# Patient Record
Sex: Female | Born: 1980 | Race: White | Hispanic: No | Marital: Single | Smoking: Never smoker
Health system: Southern US, Community
[De-identification: ages and names within clinical notes are randomized; demographics above are authoritative.]

## PROBLEM LIST (undated history)

## (undated) DIAGNOSIS — E230 Hypopituitarism: Secondary | ICD-10-CM

## (undated) DIAGNOSIS — F909 Attention-deficit hyperactivity disorder, unspecified type: Secondary | ICD-10-CM

## (undated) DIAGNOSIS — F411 Generalized anxiety disorder: Secondary | ICD-10-CM

## (undated) HISTORY — DX: Attention-deficit hyperactivity disorder, unspecified type: F90.9

## (undated) HISTORY — DX: Generalized anxiety disorder: F41.1

## (undated) HISTORY — DX: Hypopituitarism: E23.0

---

## 2013-02-15 DIAGNOSIS — E230 Hypopituitarism: Secondary | ICD-10-CM

## 2013-02-15 DIAGNOSIS — N915 Oligomenorrhea, unspecified: Secondary | ICD-10-CM | POA: Insufficient documentation

## 2013-02-15 HISTORY — DX: Hypopituitarism: E23.0

## 2014-01-09 DIAGNOSIS — F411 Generalized anxiety disorder: Secondary | ICD-10-CM

## 2014-01-09 HISTORY — DX: Generalized anxiety disorder: F41.1

## 2015-03-09 ENCOUNTER — Encounter: Payer: Self-pay | Admitting: *Deleted

## 2015-03-09 ENCOUNTER — Emergency Department (INDEPENDENT_AMBULATORY_CARE_PROVIDER_SITE_OTHER)
Admission: EM | Admit: 2015-03-09 | Discharge: 2015-03-09 | Disposition: A | Discharge status: Home / Self Care | Payer: Managed Care, Other (non HMO) | Source: Home / Self Care | Attending: Family Medicine | Admitting: Family Medicine

## 2015-03-09 DIAGNOSIS — S61451A Open bite of right hand, initial encounter: Secondary | ICD-10-CM | POA: Diagnosis not present

## 2015-03-09 DIAGNOSIS — L089 Local infection of the skin and subcutaneous tissue, unspecified: Secondary | ICD-10-CM | POA: Diagnosis not present

## 2015-03-09 DIAGNOSIS — S61452A Open bite of left hand, initial encounter: Secondary | ICD-10-CM

## 2015-03-09 DIAGNOSIS — Z23 Encounter for immunization: Secondary | ICD-10-CM

## 2015-03-09 DIAGNOSIS — S61259A Open bite of unspecified finger without damage to nail, initial encounter: Secondary | ICD-10-CM

## 2015-03-09 DIAGNOSIS — W5501XA Bitten by cat, initial encounter: Principal | ICD-10-CM

## 2015-03-09 MED ORDER — AMOXICILLIN-POT CLAVULANATE 875-125 MG PO TABS: 1.0000 | ORAL_TABLET | Freq: Two times a day (BID) | ORAL | Status: DC

## 2015-03-09 MED ORDER — KETOROLAC TROMETHAMINE 30 MG/ML IJ SOLN
30.0000 mg | Freq: Once | INTRAMUSCULAR | Status: AC
  Administered 2015-03-09: 30 mg via INTRAMUSCULAR

## 2015-03-09 MED ORDER — OXYCODONE HCL 5 MG PO CAPS: 5.0000 mg | ORAL_CAPSULE | ORAL | Status: DC | PRN

## 2015-03-09 MED ORDER — CLINDAMYCIN HCL 300 MG PO CAPS: 300.0000 mg | ORAL_CAPSULE | Freq: Four times a day (QID) | ORAL | Status: DC

## 2015-03-09 MED ORDER — TETANUS-DIPHTH-ACELL PERTUSSIS 5-2.5-18.5 LF-MCG/0.5 IM SUSP
0.5000 mL | Freq: Once | INTRAMUSCULAR | Status: AC
  Administered 2015-03-09: 0.5 mL via INTRAMUSCULAR

## 2015-03-09 NOTE — Discharge Instructions (Signed)
Elevate hand.  Keep wounds bandaged. If symptoms become significantly worse during the night or over the weekend, proceed to the local emergency room.    Animal Bite Animal bites can range from mild to serious. An animal bite can result in a scratch on the skin, a deep open cut, a puncture of the skin, a crush injury, or tearing away of the skin or a body part. A small bite from a house pet will usually not cause serious problems. However, some animal bites can become infected or injure a bone or other tissue.  Bites from certain animals can be more dangerous because of the risk of spreading rabies, which is a serious viral infection. This risk is higher with bites from stray animals or wild animals, such as raccoons, foxes, skunks, and bats. Dogs are responsible for most animal bites. Children are bitten more often than adults. SYMPTOMS  Common symptoms of an animal bite include:   Pain.   Bleeding.   Swelling.   Bruising.  DIAGNOSIS  This condition may be diagnosed based on a physical exam and medical history. Your health care provider will examine the wound and ask for details about the animal and how the bite happened. You may also have tests, such as:   Blood tests to check for infection or to determine if surgery is needed.  X-rays to check for damage to bones or joints.  Culture test. This uses a sample of fluid from the wound to check for infection. TREATMENT  Treatment varies depending on the location and type of animal bite and your medical history. Treatment may include:   Wound care. This often includes cleaning the wound, flushing the wound with saline solution, and applying a bandage (dressing). Sometimes, the wound is left open to heal because of the high risk of infection. However, in some cases, the wound may be closed with stitches (sutures), staples, skin glue, or adhesive strips.   Antibiotic medicine.   Tetanus shot.   Rabies treatment if the animal could  have rabies.  In some cases, bites that have become infected may require IV antibiotics and surgical treatment in the hospital.  HOME CARE INSTRUCTIONS Wound Care  Follow instructions from your health care provider about how to take care of your wound. Make sure you:  Wash your hands with soap and water before you change your dressing. If soap and water are not available, use hand sanitizer.  Change your dressing as told by your health care provider.  Leave sutures, skin glue, or adhesive strips in place. These skin closures may need to be in place for 2 weeks or longer. If adhesive strip edges start to loosen and curl up, you may trim the loose edges. Do not remove adhesive strips completely unless your health care provider tells you to do that.  Check your wound every day for signs of infection. Watch for:   Increasing redness, swelling, or pain.   Fluid, blood, or pus.  General Instructions  Take or apply over-the-counter and prescription medicines only as told by your health care provider.   If you were prescribed an antibiotic, take or apply it as told by your health care provider. Do not stop using the antibiotic even if your condition improves.   Keep the injured area raised (elevated) above the level of your heart while you are sitting or lying down, if this is possible.   If directed, apply ice to the injured area.   Put ice in a plastic  bag.   Place a towel between your skin and the bag.   Leave the ice on for 20 minutes, 2-3 times per day.   Keep all follow-up visits as told by your health care provider. This is important.  SEEK MEDICAL CARE IF:  You have increasing redness, swelling, or pain at the site of your wound.   You have a general feeling of sickness (malaise).   You feel nauseous or you vomit.   You have pain that does not get better.  SEEK IMMEDIATE MEDICAL CARE IF:  You have a red streak extending away from your wound.    You have fluid, blood, or pus coming from your wound.   You have a fever or chills.   You have trouble moving your injured area.   You have numbness or tingling extending beyond the wound.   This information is not intended to replace advice given to you by your health care provider. Make sure you discuss any questions you have with your health care provider.   Document Released: 02/01/2011 Document Revised: 02/04/2015 Document Reviewed: 10/01/2014 Elsevier Interactive Patient Education 2016 [employer]    Cellulitis Cellulitis is an infection of the skin and the tissue beneath it. The infected area is usually red and tender. Cellulitis occurs most often in the arms and lower legs.  CAUSES  Cellulitis is caused by bacteria that enter the skin through cracks or cuts in the skin. The most common types of bacteria that cause cellulitis are staphylococci and streptococci. SIGNS AND SYMPTOMS   Redness and warmth.  Swelling.  Tenderness or pain.  Fever. DIAGNOSIS  Your health care provider can usually determine what is wrong based on a physical exam. Blood tests may also be done. TREATMENT  Treatment usually involves taking an antibiotic medicine. HOME CARE INSTRUCTIONS   Take your antibiotic medicine as directed by your health care provider. Finish the antibiotic even if you start to feel better.  Keep the infected arm or leg elevated to reduce swelling.  Apply a warm cloth to the affected area up to 4 times per day to relieve pain.  Take medicines only as directed by your health care provider.  Keep all follow-up visits as directed by your health care provider. SEEK MEDICAL CARE IF:   You notice red streaks coming from the infected area.  Your red area gets larger or turns dark in color.  Your bone or joint underneath the infected area becomes painful after the skin has healed.  Your infection returns in the same area or another area.  You notice a swollen  bump in the infected area.  You develop new symptoms.  You have a fever. SEEK IMMEDIATE MEDICAL CARE IF:   You feel very sleepy.  You develop vomiting or diarrhea.  You have a general ill feeling (malaise) with muscle aches and pains.   This information is not intended to replace advice given to you by your health care provider. Make sure you discuss any questions you have with your health care provider.   Document Released: 02/23/2005 Document Revised: 02/04/2015 Document Reviewed: 08/01/2011 Elsevier Interactive Patient Education [employer]
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
## 2015-03-10 ENCOUNTER — Emergency Department (INDEPENDENT_AMBULATORY_CARE_PROVIDER_SITE_OTHER)
Admission: EM | Admit: 2015-03-10 | Discharge: 2015-03-10 | Disposition: A | Discharge status: Home / Self Care | Payer: Managed Care, Other (non HMO) | Source: Home / Self Care

## 2015-03-10 ENCOUNTER — Encounter: Payer: Self-pay | Admitting: *Deleted

## 2015-03-10 DIAGNOSIS — Z4800 Encounter for change or removal of nonsurgical wound dressing: Secondary | ICD-10-CM

## 2015-03-10 DIAGNOSIS — L089 Local infection of the skin and subcutaneous tissue, unspecified: Secondary | ICD-10-CM | POA: Diagnosis not present

## 2015-03-10 MED ORDER — OXYCODONE HCL 5 MG PO CAPS: ORAL_CAPSULE | ORAL | Status: DC

## 2015-03-10 MED ORDER — KETOROLAC TROMETHAMINE 60 MG/2ML IM SOLN
60.0000 mg | Freq: Once | INTRAMUSCULAR | Status: AC
  Administered 2015-03-10: 60 mg via INTRAMUSCULAR

## 2015-03-10 MED ORDER — KETOROLAC TROMETHAMINE 10 MG PO TABS: 10.0000 mg | ORAL_TABLET | Freq: Four times a day (QID) | ORAL | Status: DC | PRN

## 2015-03-10 NOTE — Discharge Instructions (Signed)
Keep hands elevated as much as possible.  Keep wounds lightly bandaged and change daily.  May apply heating pad every 2 to 3 hours.

## 2015-03-10 NOTE — ED Notes (Signed)
Cassandra Christensen is here today for a recheck of her cat bites.

## 2015-03-10 NOTE — ED Provider Notes (Signed)
CSN: [id number]     Arrival date & time 03/10/15  1336 History   None    Chief Complaint  Patient presents with  . Wound Check      HPI Comments: Patient returns for follow-up of cat-bite cellulitis fingers.  She reports that she still has significant pain, but was able to sleep with pain medication.  She has had three doses of her oral antibiotics.  The history is provided by the patient and a friend.    History reviewed. No pertinent past medical history. History reviewed. No pertinent past surgical history. History reviewed. No pertinent family history. Social History  Substance Use Topics  . Smoking status: Never Smoker   . Smokeless tobacco: None  . Alcohol Use: Yes     Comment: 3 q wk   OB History    No data available     Review of Systems No fevers, chills, and sweats.  Allergies  Latex  Home Medications   Prior to Admission medications   Medication Sig Start Date End Date Taking? Authorizing Provider  amoxicillin (AMOXIL) 500 MG capsule Take 500 mg by mouth 3 (three) times daily.    Historical Provider, MD  amoxicillin-clavulanate (AUGMENTIN) 875-125 MG tablet Take 1 tablet by mouth 2 (two) times daily. Take with food 03/09/15   Lattie Haw, MD  clindamycin (CLEOCIN) 300 MG capsule Take 1 capsule (300 mg total) by mouth 4 (four) times daily. 03/09/15   Lattie Haw, MD  estradiol (ESTRACE) 2 MG tablet Take 2 mg by mouth daily.    Historical Provider, MD  ketorolac (TORADOL) 10 MG tablet Take 1 tablet (10 mg total) by mouth every 6 (six) hours as needed for moderate pain. 03/10/15   Lattie Haw, MD  medroxyPROGESTERone (PROVERA) 5 MG tablet Take 5 mg by mouth daily.    Historical Provider, MD  oxycodone (OXY-IR) 5 MG capsule Take one cap by mouth every 4 to 6 hours as needed for pain.  May take two at bedtime 03/10/15   Lattie Haw, MD   Meds Ordered and Administered this Visit   Medications  ketorolac (TORADOL) injection 60 mg (60 mg Intramuscular  Given 03/10/15 1414)    BP 124/88 mmHg  Pulse 95  Temp(Src) 97.7 F (36.5 C) (Oral)  Resp 16  SpO2 100% No data found.   Physical Exam Nursing notes and Vital Signs reviewed. Appearance:  Patient appears comfortable but has pain during bandage removal. Left hand:  Second finger remains painful to palpation over PIP joint.  However, swelling and erythema have decreased, and there is no drainage from puncture wounds.  PIP joint has increased range of motion.  Erythema on dorsum of left hand almost resolved. Right hand:  Third finger tip remains tender to palpation, but swelling and erythema have decreased.  Range of motion DIP joint increased.  Erythema and ascending lymphangitis on dorsum of right hand has resolved.   ED Course  Procedures  None    MDM   1. Dressing change or removal, nonsurgical wound.  Wounds slightly improved; ascending lymphangitis cleared.    Administered Toradol  IM Wounds bandaged with non-stick dressings. Continue antibiotics.  Add oral Toradol for 4 days.   Keep hands elevated as much as possible.  Keep wounds lightly bandaged and change daily.  May apply heating pad every 2 to 3 hours. Recommend taking probiotic.  Discussed signs and symptoms of C. Diff colitis Return tomorrow for follow-up; if no interval improvement, recommend orthopedic  referral.    Lattie Haw, MD 03/10/15 [phone]

## 2015-03-11 ENCOUNTER — Encounter: Payer: Self-pay | Admitting: *Deleted

## 2015-03-11 ENCOUNTER — Emergency Department
Admission: EM | Admit: 2015-03-11 | Discharge: 2015-03-11 | Disposition: A | Discharge status: Home / Self Care | Payer: Managed Care, Other (non HMO) | Source: Home / Self Care | Attending: Family Medicine | Admitting: Family Medicine

## 2015-03-11 ENCOUNTER — Emergency Department (INDEPENDENT_AMBULATORY_CARE_PROVIDER_SITE_OTHER): Payer: Managed Care, Other (non HMO)

## 2015-03-11 ENCOUNTER — Encounter (HOSPITAL_COMMUNITY): Payer: Self-pay | Admitting: Emergency Medicine

## 2015-03-11 DIAGNOSIS — Y9289 Other specified places as the place of occurrence of the external cause: Secondary | ICD-10-CM | POA: Diagnosis not present

## 2015-03-11 DIAGNOSIS — Y9389 Activity, other specified: Secondary | ICD-10-CM | POA: Insufficient documentation

## 2015-03-11 DIAGNOSIS — W5501XA Bitten by cat, initial encounter: Secondary | ICD-10-CM | POA: Insufficient documentation

## 2015-03-11 DIAGNOSIS — Z793 Long term (current) use of hormonal contraceptives: Secondary | ICD-10-CM | POA: Diagnosis not present

## 2015-03-11 DIAGNOSIS — W5501XD Bitten by cat, subsequent encounter: Principal | ICD-10-CM

## 2015-03-11 DIAGNOSIS — Z91040 Latex allergy status: Secondary | ICD-10-CM | POA: Diagnosis not present

## 2015-03-11 DIAGNOSIS — M7989 Other specified soft tissue disorders: Secondary | ICD-10-CM | POA: Diagnosis not present

## 2015-03-11 DIAGNOSIS — Z79899 Other long term (current) drug therapy: Secondary | ICD-10-CM | POA: Insufficient documentation

## 2015-03-11 DIAGNOSIS — S61231D Puncture wound without foreign body of left index finger without damage to nail, subsequent encounter: Secondary | ICD-10-CM | POA: Insufficient documentation

## 2015-03-11 DIAGNOSIS — Y998 Other external cause status: Secondary | ICD-10-CM | POA: Insufficient documentation

## 2015-03-11 DIAGNOSIS — L089 Local infection of the skin and subcutaneous tissue, unspecified: Secondary | ICD-10-CM

## 2015-03-11 DIAGNOSIS — S61259D Open bite of unspecified finger without damage to nail, subsequent encounter: Principal | ICD-10-CM

## 2015-03-11 NOTE — ED Notes (Signed)
Cassandra Christensen is here today for a recheck of her wounds.

## 2015-03-11 NOTE — ED Provider Notes (Signed)
CSN: [id number]     Arrival date & time 03/11/15  1647 History   First MD Initiated Contact with Patient 03/11/15 1651     Chief Complaint  Patient presents with  . Wound Check   (Consider location/radiation/quality/duration/timing/severity/associated sxs/prior Treatment) HPI  Pt is a 34yo female presenting [facility] for recheck of cat bite wounds to Left index finger and Right middle finger that occurred 4 days ago.  Pt took 1000mg  amoxicillin initially after incident and 500mg  the next morning until she was able to be seen in UC.  Pt was then started on Augmentin and Clindamycin.  Pt states pain, swelling and redness have improved in Right distal middle finger but she still has severe aching throbbing pain with limited ROM Left index finger due to severe pain and swelling.  She has been taking oxycodone with minimal relief.  Denies fever or chills. She has been taking antibiotics as prescribed.    History reviewed. No pertinent past medical history. History reviewed. No pertinent past surgical history. History reviewed. No pertinent family history. Social History  Substance Use Topics  . Smoking status: Never Smoker   . Smokeless tobacco: None  . Alcohol Use: Yes     Comment: 3 q wk   OB History    No data available     Review of Systems  Constitutional: Negative for fever and chills.  Gastrointestinal: Negative for nausea, vomiting, abdominal pain and diarrhea.  Musculoskeletal: Positive for myalgias, joint swelling and arthralgias. Negative for neck pain and neck stiffness.  Skin: Positive for color change and wound.    Allergies  Latex  Home Medications   Prior to Admission medications   Medication Sig Start Date End Date Taking? Authorizing Provider  amoxicillin-clavulanate (AUGMENTIN) 875-125 MG tablet Take 1 tablet by mouth 2 (two) times daily. Take with food 03/09/15   Lattie HawStephen A Beese, MD  clindamycin (CLEOCIN) 300 MG capsule Take 1 capsule (300 mg total) by mouth 4  (four) times daily. 03/09/15   Lattie HawStephen A Beese, MD  estradiol (ESTRACE) 2 MG tablet Take 2 mg by mouth daily.    Historical Provider, MD  ketorolac (TORADOL) 10 MG tablet Take 1 tablet (10 mg total) by mouth every 6 (six) hours as needed for moderate pain. 03/10/15   Lattie HawStephen A Beese, MD  medroxyPROGESTERone (PROVERA) 5 MG tablet Take 5 mg by mouth daily.    Historical Provider, MD  oxycodone (OXY-IR) 5 MG capsule Take one cap by mouth every 4 to 6 hours as needed for pain.  May take two at bedtime 03/10/15   Lattie HawStephen A Beese, MD   Meds Ordered and Administered this Visit  Medications - No data to display  BP 113/82 mmHg  Pulse 90  Temp(Src) 97.5 F (36.4 C) (Oral)  Resp 16  SpO2 100% No data found.   Physical Exam  Constitutional: She is oriented to person, place, and time. She appears well-developed and well-nourished.  HENT:  Head: Normocephalic and atraumatic.  Eyes: EOM are normal.  Neck: Normal range of motion.  Cardiovascular: Normal rate.   Left index finger: cap refill < 3 seconds  Right middle finger: cap refill < 3 seconds  Pulmonary/Chest: Effort normal.  Musculoskeletal: She exhibits edema and tenderness.  Left index finger: moderate edema around PIP, severe tenderness with light touch. Erythema. Limited ROM at DIP and MCP, unable to flex at PIP. (see skin exam) Full ROM Thumb, middle, ring, and little finger Left hand.  Right distal middle finger: mild  edema, decreased flexion at DIP (see skin exam). Full ROM thumb, index, ring, and little finger Right hand  Neurological: She is alert and oriented to person, place, and time.  Skin: Skin is warm and dry. There is erythema.  Left index finger: triangular wound at ulnar side of PIP, puncture wound to radial side of PIP,  mild erythema  Right middle finger, distal aspect: erythema at base of nail, puncture wound volar aspect of finger. No induration or fluctuance.   Psychiatric: She has a normal mood and affect. Her  behavior is normal.  Nursing note and vitals reviewed.   ED Course  Procedures (including critical care time)  Labs Review Labs Reviewed - No data to display  Imaging Review Dg Finger Index Left  03/11/2015  CLINICAL DATA:  Pain following cat bite EXAM: LEFT SECOND FINGER 2+V COMPARISON:  None. FINDINGS: Frontal, oblique, and lateral views were obtained. There is soft tissue swelling. No fracture or dislocation. Joint spaces appear intact. No erosive change or bony destruction. No radiopaque foreign body. IMPRESSION: Soft tissue swelling. No bony lesion appreciable. No radiopaque foreign body. Joint spaces appear intact. Electronically Signed   By: Bretta Bang III M.D.   On: 03/11/2015 17:46   Dg Finger Middle Right  03/11/2015  CLINICAL DATA:  Cat bite EXAM: RIGHT THIRD FINGER 2+V COMPARISON:  None. FINDINGS: Frontal, oblique and lateral views were obtained. There is soft tissue swelling distally, particularly in the subungual region. No radiopaque foreign body. No fracture or dislocation. No erosive change or bony destruction. IMPRESSION: Soft tissue swelling distally, most notably in the subungual region. No soft tissue air or radiopaque foreign body. No bony abnormality. No joint abnormality. Electronically Signed   By: Bretta Bang III M.D.   On: 03/11/2015 17:47      MDM   1. Infected cat bite of finger, subsequent encounter    Pt is a 34yo female presenting to [facility] for recheck of infected cat bite of distal Right middle finger and Left index finger. No improvement despite aggressive PO antibiotics with Clindamycin and Augmentin.   Consulted with Dr. Denyse Amass, who also examined pt, agrees concern for infected tenosynovitis of Left index finger. Right middle finger also still appears infected but not as concerning as Left index finger. Attempted to consult hand surgery, however, receptionist for [facility] requested pt be sent to emergency department for hand  surgery consult.  Pt sent to [facility] for further evaluation and treatment.        Junius Finner, PA-C 03/11/15 1756

## 2015-03-11 NOTE — ED Notes (Addendum)
Onset 3 days ago cat bite left 2nd finger and right 3rd finger. Seen at urgent care 2 days ago given antibiotics and seen again yesterday and today for wound check. Sent to ED to see hand specialist and surgery to left 2nd finger.

## 2015-03-12 ENCOUNTER — Emergency Department (HOSPITAL_COMMUNITY)
Admission: EM | Admit: 2015-03-12 | Discharge: 2015-03-12 | Disposition: A | Discharge status: Home / Self Care | Payer: Managed Care, Other (non HMO) | Attending: Emergency Medicine | Admitting: Emergency Medicine

## 2015-03-12 DIAGNOSIS — W5501XD Bitten by cat, subsequent encounter: Secondary | ICD-10-CM

## 2015-03-12 MED ORDER — BUPIVACAINE HCL (PF) 0.5 % IJ SOLN
50.0000 mL | Freq: Once | INTRAMUSCULAR | Status: AC
  Administered 2015-03-12: 50 mL
  Filled 2015-03-12: qty 60

## 2015-03-12 MED ORDER — KETOROLAC TROMETHAMINE 30 MG/ML IJ SOLN
30.0000 mg | Freq: Once | INTRAMUSCULAR | Status: AC
  Administered 2015-03-12: 30 mg via INTRAVENOUS
  Filled 2015-03-12: qty 1

## 2015-03-12 MED ORDER — FENTANYL CITRATE (PF) 100 MCG/2ML IJ SOLN
50.0000 ug | INTRAMUSCULAR | Status: DC | PRN
  Administered 2015-03-12: 50 ug via INTRAVENOUS
  Filled 2015-03-12: qty 2

## 2015-03-12 MED ORDER — CLINDAMYCIN PHOSPHATE 900 MG/50ML IV SOLN
900.0000 mg | Freq: Once | INTRAVENOUS | Status: AC
  Administered 2015-03-12: 900 mg via INTRAVENOUS
  Filled 2015-03-12: qty 50

## 2015-03-12 MED ORDER — OXYCODONE HCL 5 MG PO TABS: 5.0000 mg | ORAL_TABLET | ORAL | Status: DC | PRN

## 2015-03-12 MED ORDER — ONDANSETRON HCL 4 MG/2ML IJ SOLN
4.0000 mg | Freq: Once | INTRAMUSCULAR | Status: AC
  Administered 2015-03-12: 4 mg via INTRAVENOUS
  Filled 2015-03-12: qty 2

## 2015-03-12 MED ORDER — OXYCODONE-ACETAMINOPHEN 5-325 MG PO TABS: 2.0000 | ORAL_TABLET | ORAL | Status: DC | PRN

## 2015-03-12 MED ORDER — MORPHINE SULFATE (PF) 4 MG/ML IV SOLN
4.0000 mg | INTRAVENOUS | Status: DC | PRN
  Administered 2015-03-12: 4 mg via INTRAVENOUS
  Filled 2015-03-12: qty 1

## 2015-03-12 NOTE — Discharge Instructions (Signed)
Change dressing daily.  Soak the wound, and gently massage morning and evening.  Continue antibiotics as prescribed.  Call Dr. Orlan Leavensrtman for follow up appointment.

## 2015-03-12 NOTE — ED Notes (Signed)
MD at bedside. 

## 2015-03-12 NOTE — ED Provider Notes (Signed)
CSN: [id number]     Arrival date & time 03/11/15  2156 History  By signing my name below, I, Doreatha Martin, attest that this documentation has been prepared under the direction and in the presence of Rolland Porter, MD. Electronically Signed: Doreatha Martin, ED Scribe. 03/12/2015. 12:51 AM.   Chief Complaint  Patient presents with  . Animal Bite   The history is provided by the patient. No language interpreter was used.    HPI Comments: Cassandra Christensen is a 34 y.o. female who presents to the Emergency Department complaining of multiple, worsening cat bites that occurred 3 days ago. The cat was her father's and is UTD on vaccinations. Pt took 3 doses of Amoxicillin the night of the bites and the morning after before being seen at [facility] the next day. Pts initial UC encounter for the bites was on 10/10- given rx for q6h  Clindamycin, q12h 875-125mg  Augmentin; returned for wound check on 10/11- given rx for 4 days of Toradol; was also seen 7 hours ago after wounds showed no improvement and was referred to the ED for consult with a hand specialist. She has been taking antibiotics as prescribed. She states that the wounds look worse today than yesterday and notes an associated a throbbing pain. She notes that she has seen improvement of pain with IM Toradol given at [facility]. Pt reports that pain is worsened with movement and palpation. She states that she had XR tonight at [facility]. NKDA.   History reviewed. No pertinent past medical history. History reviewed. No pertinent past surgical history. No family history on file. Social History  Substance Use Topics  . Smoking status: Never Smoker   . Smokeless tobacco: None  . Alcohol Use: Yes     Comment: 3 q wk   OB History    No data available     Review of Systems  Constitutional: Negative for fever, chills, diaphoresis, appetite change and fatigue.  HENT: Negative for mouth sores, sore throat and trouble swallowing.   Eyes: Negative for visual disturbance.   Respiratory: Negative for cough, chest tightness, shortness of breath and wheezing.   Cardiovascular: Negative for chest pain.  Gastrointestinal: Negative for nausea, vomiting, abdominal pain, diarrhea and abdominal distention.  Endocrine: Negative for polydipsia, polyphagia and polyuria.  Genitourinary: Negative for dysuria, frequency and hematuria.  Musculoskeletal: Positive for arthralgias. Negative for gait problem.  Skin: Positive for wound. Negative for color change, pallor and rash.  Neurological: Negative for dizziness, syncope, light-headedness and headaches.  Hematological: Does not bruise/bleed easily.  Psychiatric/Behavioral: Negative for behavioral problems and confusion.   Allergies  Latex  Home Medications   Prior to Admission medications   Medication Sig Start Date End Date Taking? Authorizing Provider  amoxicillin-clavulanate (AUGMENTIN) 875-125 MG tablet Take 1 tablet by mouth 2 (two) times daily. Take with food 03/09/15  Yes Lattie Haw, MD  amphetamine-dextroamphetamine (ADDERALL) 10 MG tablet Take 10 mg by mouth daily as needed (may take at lunch as needed for inattention).  03/02/15  Yes Historical Provider, MD  amphetamine-dextroamphetamine (ADDERALL) 20 MG tablet Take 20 mg by mouth daily. 03/02/15  Yes Historical Provider, MD  clindamycin (CLEOCIN) 300 MG capsule Take 1 capsule (300 mg total) by mouth 4 (four) times daily. 03/09/15  Yes Lattie Haw, MD  estradiol (ESTRACE) 2 MG tablet Take 2 mg by mouth daily.   Yes Historical Provider, MD  ketorolac (TORADOL) 10 MG tablet Take 1 tablet (10 mg total) by mouth every 6 (six) hours  as needed for moderate pain. 03/10/15  Yes Lattie HawStephen A Beese, MD  medroxyPROGESTERone (PROVERA) 5 MG tablet Take 25 mg by mouth See admin instructions. Take 5 tablets once every 3 months    Historical Provider, MD  oxyCODONE (ROXICODONE) 5 MG immediate release tablet Take 1 tablet (5 mg total) by mouth every 4 (four) hours as needed for  severe pain. 03/12/15   Rolland PorterMark Jaysa Kise, MD  oxyCODONE-acetaminophen (PERCOCET/ROXICET) 5-325 MG tablet Take 2 tablets by mouth every 4 (four) hours as needed. 03/12/15   Rolland PorterMark Kashton Mcartor, MD   BP 104/62 mmHg  Pulse 66  Temp(Src) 97.6 F (36.4 C) (Oral)  Resp 18  Ht 5\' 7"  (1.702 m)  Wt 133 lb (60.328 kg)  BMI 20.83 kg/m2  SpO2 99%  LMP 06/30/2014 Physical Exam  Constitutional: She is oriented to person, place, and time. She appears well-developed and well-nourished. No distress.  HENT:  Head: Normocephalic.  Eyes: Conjunctivae are normal. Pupils are equal, round, and reactive to light. No scleral icterus.  Neck: Normal range of motion. Neck supple. No thyromegaly present.  Cardiovascular: Normal rate and regular rhythm.  Exam reveals no gallop and no friction rub.   No murmur heard. Pulmonary/Chest: Effort normal and breath sounds normal. No respiratory distress. She has no wheezes. She has no rales.  Abdominal: Soft. Bowel sounds are normal. She exhibits no distension. There is no tenderness. There is no rebound.  Musculoskeletal: Normal range of motion.  Neurological: She is alert and oriented to person, place, and time.  Skin: Skin is warm and dry. No rash noted.  Left index finger with punctures dorsal and lateral to PIP; no volar swelling, no signs of synovitis.   Psychiatric: She has a normal mood and affect. Her behavior is normal.  Nursing note and vitals reviewed.  ED Course  Procedures (including critical care time) DIAGNOSTIC STUDIES: Oxygen Saturation is 100% on RA, normal by my interpretation.    COORDINATION OF CARE: 12:50 AM Discussed treatment plan with pt at bedside and pt agreed to plan.   Labs Review Labs Reviewed - No data to display  Imaging Review Dg Finger Index Left  03/11/2015  CLINICAL DATA:  Pain following cat bite EXAM: LEFT SECOND FINGER 2+V COMPARISON:  None. FINDINGS: Frontal, oblique, and lateral views were obtained. There is soft tissue swelling. No  fracture or dislocation. Joint spaces appear intact. No erosive change or bony destruction. No radiopaque foreign body. IMPRESSION: Soft tissue swelling. No bony lesion appreciable. No radiopaque foreign body. Joint spaces appear intact. Electronically Signed   By: Bretta BangWilliam  Woodruff III M.D.   On: 03/11/2015 17:46   Dg Finger Middle Right  03/11/2015  CLINICAL DATA:  Cat bite EXAM: RIGHT THIRD FINGER 2+V COMPARISON:  None. FINDINGS: Frontal, oblique and lateral views were obtained. There is soft tissue swelling distally, particularly in the subungual region. No radiopaque foreign body. No fracture or dislocation. No erosive change or bony destruction. IMPRESSION: Soft tissue swelling distally, most notably in the subungual region. No soft tissue air or radiopaque foreign body. No bony abnormality. No joint abnormality. Electronically Signed   By: Bretta BangWilliam  Woodruff III M.D.   On: 03/11/2015 17:47   I have personally reviewed and evaluated these images and lab results as part of my medical decision-making.  MDM   Final diagnoses:  Cat bite, subsequent encounter    This wound appears to be local infection. I do not elicit any tenderness volar aspect of the finger or hand. No pain, or  tenderness along the flexor tendons. There is localized soft tissue swelling on the dorsum primarily dorsal volar just proximal to the PIP.  I discussed the case with Dr. Melvyn Novas of hand surgery. I've asked about follow-up. He recommended incision and drainage of the wound here. Patient given IV Clinoril has not 100 mg. Digital block performed. Simply using the tip of an iris scissors and was able to spread from wounds. Minimal turbid fluid obtained from the personal ulnar wound. Then placed in a gauze dressing. Discharged home. Given Dr. Glenna Durand information to call for follow-up appointment. Here with any worsening. Continue antibiotics as prescribed Augmentin and clindamycin.     I personally performed the services  described in this documentation, which was scribed in my presence. The recorded information has been reviewed and is accurate.   Rolland Porter, MD 03/13/15 [phone]

## 2015-03-12 NOTE — ED Notes (Signed)
Discharge instructions/prescription reviewed with patient. Understanding verbalized. Patient declined wheelchair at time of discharge. No acute distress noted. 

## 2015-03-14 LAB — WOUND CULTURE: Gram Stain: NONE SEEN

## 2015-03-15 ENCOUNTER — Telehealth: Payer: Self-pay | Admitting: Emergency Medicine

## 2015-04-28 ENCOUNTER — Ambulatory Visit: Payer: Managed Care, Other (non HMO) | Admitting: Internal Medicine

## 2015-06-26 ENCOUNTER — Encounter: Payer: Self-pay | Admitting: *Deleted

## 2015-06-26 ENCOUNTER — Emergency Department
Admission: EM | Admit: 2015-06-26 | Discharge: 2015-06-26 | Disposition: A | Discharge status: Home / Self Care | Payer: Managed Care, Other (non HMO) | Source: Home / Self Care | Attending: Family Medicine | Admitting: Family Medicine

## 2015-06-26 DIAGNOSIS — J039 Acute tonsillitis, unspecified: Secondary | ICD-10-CM

## 2015-06-26 DIAGNOSIS — J029 Acute pharyngitis, unspecified: Secondary | ICD-10-CM

## 2015-06-26 LAB — POCT RAPID STREP A (OFFICE): Rapid Strep A Screen: NEGATIVE

## 2015-06-26 MED ORDER — AMOXICILLIN 500 MG PO CAPS: 500.0000 mg | ORAL_CAPSULE | Freq: Two times a day (BID) | ORAL | Status: DC

## 2015-06-26 NOTE — Discharge Instructions (Signed)
You may take 400-600mg Ibuprofen (Motrin) every 6-8 hours for fever and pain  °Follow-up with your primary care provider next week for recheck of symptoms if not improving.  °Be sure to drink plenty of fluids and rest, at least 8hrs of sleep a night, preferably more while you are sick. °Return urgent care or go to closest ER if you cannot keep down fluids/signs of dehydration, fever not reducing with Tylenol, difficulty breathing/wheezing, stiff neck, worsening condition, or other concerns (see below)  °Please take antibiotics as prescribed and be sure to complete entire course even if you start to feel better to ensure infection does not come back. ° ° °

## 2015-06-26 NOTE — ED Provider Notes (Signed)
CSN: [id number]     Arrival date & time 06/26/15  1308 History   First MD Initiated Contact with Patient 06/26/15 1311     Chief Complaint  Patient presents with  . Sore Throat   (Consider location/radiation/quality/duration/timing/severity/associated sxs/prior Treatment) HPI  Pt is a 35yo female presenting to [facility] with c/o worsening sore throat for 3 days.  Pain is worse with swallowing. Pain is mild to moderate in severity.  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  She has been taking ibuprofen.  She is allergic to acetaminophen as it gives her hives.     History reviewed. No pertinent past medical history. History reviewed. No pertinent past surgical history. History reviewed. No pertinent family history. Social History  Substance Use Topics  . Smoking status: Never Smoker   . Smokeless tobacco: None  . Alcohol Use: Yes     Comment: 3 q wk   OB History    No data available     Review of Systems  Constitutional: Negative for fever and chills.  HENT: Positive for sore throat. Negative for congestion, ear pain, trouble swallowing and voice change.   Respiratory: Negative for cough and shortness of breath.   Cardiovascular: Negative for chest pain and palpitations.  Gastrointestinal: Negative for nausea, vomiting, abdominal pain and diarrhea.  Musculoskeletal: Negative for myalgias, back pain and arthralgias.  Skin: Negative for rash.    Allergies  Latex and Tylenol  Home Medications   Prior to Admission medications   Medication Sig Start Date End Date Taking? Authorizing Provider  amoxicillin (AMOXIL) 500 MG capsule Take 1 capsule (500 mg total) by mouth 2 (two) times daily. For 10 days 06/26/15   Junius Finner, PA-C  amphetamine-dextroamphetamine (ADDERALL) 10 MG tablet Take 10 mg by mouth daily as needed (may take at lunch as needed for inattention).   03/02/15   Historical Provider, MD  amphetamine-dextroamphetamine (ADDERALL) 20 MG tablet Take 20 mg by mouth daily. 03/02/15   Historical Provider, MD  estradiol (ESTRACE) 2 MG tablet Take 2 mg by mouth daily.    Historical Provider, MD  medroxyPROGESTERone (PROVERA) 5 MG tablet Take 25 mg by mouth See admin instructions. Take 5 tablets once every 3 months    Historical Provider, MD   Meds Ordered and Administered this Visit  Medications - No data to display  BP 131/91 mmHg  Pulse 70  Temp(Src) 98.1 F (36.7 C) (Oral)  Resp 16  Ht  (1.727 m)  Wt 135 lb (61.236 kg)  BMI 20.53 kg/m2  SpO2 100% No data found.   Physical Exam  Constitutional: She appears well-developed and well-nourished. No distress.  HENT:  Head: Normocephalic and atraumatic.  Right Ear: Hearing, tympanic membrane, external ear and ear canal normal.  Left Ear: Hearing, tympanic membrane, external ear and ear canal normal.  Nose: Nose normal.  Mouth/Throat: Uvula is midline and mucous membranes are normal. No trismus in the jaw. Oropharyngeal exudate, posterior oropharyngeal edema and posterior oropharyngeal erythema present. No tonsillar abscesses.  Eyes: Conjunctivae are normal. No scleral icterus.  Neck: Normal range of motion. Neck supple.  Cardiovascular: Normal rate, regular rhythm and normal heart sounds.   Pulmonary/Chest: Effort normal and breath sounds normal. No stridor. No respiratory distress. She has no wheezes. She has no rales. She exhibits no tenderness.  Abdominal: Soft. She exhibits no distension and no  mass. There is no tenderness. There is no rebound and no guarding.  Musculoskeletal: Normal range of motion.  Lymphadenopathy:    She has cervical adenopathy.  Neurological: She is alert.  Skin: Skin is warm and dry. She is not diaphoretic.  Nursing note and vitals reviewed.   ED Course  Procedures (including critical care time)  Labs Review Labs Reviewed  STREP A DNA PROBE  POCT  RAPID STREP A (OFFICE)    Imaging Review No results found.    MDM   1. Acute pharyngitis, unspecified etiology   2. Tonsillitis with exudate    Pt c/o worsening sore throat for 3 days, exposure to strep. No evidence of peritonsillar abscess on exam. Rapid strep: negative, however will send culture and offer antibiotics as Centor score is 3/4  Pt encouraged to hold for 2-3 days as culture is pending, however, may start antibiotic treatment if pain worsens or she develops a fever.  Advised pt to use acetaminophen and ibuprofen as needed for fever and pain. Encouraged rest and fluids. F/u with PCP in 5-7 days if not improving, sooner if worsening. Pt verbalized understanding and agreement with tx plan.    Junius Finner, PA-C 06/26/15 1348

## 2015-06-26 NOTE — ED Notes (Signed)
Pt c/o sore throat x 3 days. Denies fever.

## 2015-06-27 LAB — STREP A DNA PROBE: GASP: NOT DETECTED

## 2015-06-28 ENCOUNTER — Telehealth: Payer: Self-pay | Admitting: Emergency Medicine

## 2015-06-30 ENCOUNTER — Encounter: Payer: Self-pay | Admitting: Osteopathic Medicine

## 2015-06-30 ENCOUNTER — Ambulatory Visit (INDEPENDENT_AMBULATORY_CARE_PROVIDER_SITE_OTHER): Payer: Managed Care, Other (non HMO) | Admitting: Osteopathic Medicine

## 2015-06-30 VITALS — BP 121/80 | HR 80 | Temp 98.8°F | Ht 67.0 in | Wt 136.0 lb

## 2015-06-30 DIAGNOSIS — J0110 Acute frontal sinusitis, unspecified: Secondary | ICD-10-CM | POA: Diagnosis not present

## 2015-06-30 DIAGNOSIS — R059 Cough, unspecified: Secondary | ICD-10-CM

## 2015-06-30 DIAGNOSIS — R05 Cough: Secondary | ICD-10-CM | POA: Diagnosis not present

## 2015-06-30 DIAGNOSIS — F908 Attention-deficit hyperactivity disorder, other type: Secondary | ICD-10-CM

## 2015-06-30 DIAGNOSIS — F909 Attention-deficit hyperactivity disorder, unspecified type: Secondary | ICD-10-CM

## 2015-06-30 HISTORY — DX: Attention-deficit hyperactivity disorder, unspecified type: F90.9

## 2015-06-30 MED ORDER — GUAIFENESIN-CODEINE 100-10 MG/5ML PO SYRP: 5.0000 mL | ORAL_SOLUTION | Freq: Four times a day (QID) | ORAL | Status: AC | PRN

## 2015-06-30 MED ORDER — IPRATROPIUM BROMIDE 0.03 % NA SOLN: 2.0000 | Freq: Two times a day (BID) | NASAL | Status: AC

## 2015-06-30 MED ORDER — AMOXICILLIN-POT CLAVULANATE 875-125 MG PO TABS: 1.0000 | ORAL_TABLET | Freq: Two times a day (BID) | ORAL | Status: AC

## 2015-06-30 NOTE — Patient Instructions (Signed)
DR. Jailynne Opperman'S HOME CARE INSTRUCTIONS: UPPER RESPIRATORY ILLNESS AND SINUSITIS   FRIST, A FEW NOTES ON OVER-THE-COUNTER MEDICATIONS!  . USE CAUTION - MANY OVER-THE-COUNTER MEDICATIONS COME IN COMBINATIONS OF MULTIPLE GENERICS. FOR INSTANCE, NYQUIL HAS TYLENOL + COUGH MEDICINE + AN ANTIHISTAMINE, SO BE CAREFUL YOU'RE NOT TAKING A COMBINATION MEDICINE WHICH CONTAINS MEDICATIONS YOU'RE ALSO TAKING SEPARATELY (LIKE NYQUIL SYRUP AS WELL AS TYLENOL PILLS).  . YOUR PHARMACIST CAN HELP YOU AVOID MEDICATION INTERACTIONS AND DUPLICATIONS - ASK FOR THEIR HELP IF YOU ARE CONFUSED OR UNSURE ABOUT WHAT TO PURCHASE OVER-THE-COUNTER!  . REMEMBER - IF YOU'RE EVER CONCERNED ABOUT MEDICATION SIDE EFFECTS, OR IF YOU'RE EVER CONCERNED YOUR SYMPTOMS ARE GETTING WORSE DESPITE TREATMENT, PLEASE CALL THE OFFICE!   TREAT SINUS CONGESTION, RUNNY NOSE & POSTNASAL DRIP: . Treat to increase airflow through sinuses, decrease congestion pain and prevent bacterial growth!  . Remember, only 0.5-2% of sinus infections are due to a bacteria, the rest are due to a virus (usually the common cold)! Trust your doctor when he or she decides whether or not you really need an antibiotic!   NASAL SPRAYS: generally safe and should not interact with other medicines. Can take any of these medications, either alone or together... FLONASE (FLUTICASONE) - 2 sprays in each nostril twice per day (also a great allergy medicine to use long-term!) AFRIN (OXYMETOLAZONE) - use sparingly because it will cause rebound congestion, NEVER USE IN KIDS   SALINE NASAL SPRAY- no limit, but avoid use after other nasal sprays or it can wash the medicine away  ANTIHISTAMINES: Helps dry out runny nose and decreases postnasal drip. Benadryl may cause drowsiness but other preparations should not be as sedating. Certain kinds are not as safe in elderly individuals. OK to use unless Dr A says otherwise.  Only use one of the following... BENADRYL (DIPHENHYDRAMINE) -  25-50 mg every 6 hours ZYRTEC (CETIRIZINE) - 5-10 mg daily CLARITIN (LORATIDINE) - less potent. 10 mg daily ALLEGRA (FEXOFENADINE) - least likely to cause drowsiness! 180 mg daily or 60 mg twice per day  DECONGESTANTS: Helps dry out runny nose and helps with sinus pain. May cause insomnia, or sometimes elevated heart rate. Can cause problems if used often in people with high blood pressure. OK to use unless Dr A says otherwise. NEVER USE IN KIDS UNDER 2 YEARS OLD. Only use one of the following... SUDAFED (PSEUDOEPHEDINE) - 60 mg every 4 - 6 hours, also comes in 120 mg extended release every 12 hours, maximum 240 mg in 24 hours.  SUDAED PE (PHENYLEPHRINE) - 10 mg every 4 - 6 hours, maximum 60 mg per day  COMBINATIONS OF ANTIHISTAMINE + DECONGESTANT: these usually require you to show your ID at the pharmacy counter. You can also purchase these medicines separately as noted above.  Only use one of the following... ZYRTEC-D (CETIRIZINE + PSEUDOEPHEDRINE) - 12 hour formulation as directed CLARITIN-D (LORATIDINE + PSEUDOEPHEDRINE) - 12 and 24 hour formulations as directed ALLEGRA-D (FEXOFENADINE + PSEUDOEPHEDRINE) - 12 and 24 hour formulations as directed  PRESCRIPTION TREATMENT FOR SINUS PROBLEMS: Can include nasal sprays, pills, or antibiotics in the case of true bacterial infection. Not everyone needs an antibiotic but there are other medicines which will help you feel better while your body fights the infection!   TREAT COUGH & SORE THROAT: . Remember, cough is the body's way of protecting your airways and lungs, it's a hard-wired reflex that is tough for medicines to treat!  . Irritation to the airways will cause   cough. This irritation is usually caused by upper airway problems like postnasal drip (treat as above) and viral sore throat, but in severe cases can be due to lower airway problems like bronchitis or pneumonia, which a doctor can usually hear on exam of your lungs or an X-ray. . Sore  throat is almost always due to a virus, but occasionally caused by Strep, which requires antibiotics.  . Exercise and smoking may make cough worse - take it easy while you're sick, and QUIT SMOKING!   . Cough due to viral infection can linger for 2 weeks or so. If you're coughing longer than you think you should, or if the cough is severe, please make an appointment in the office - you may need a chest X-ray.   EXPECTORANT: Used to help clear airways, take these with PLENTY of water to help thin mucus secretions and make the mucus easier to cough up   Only use one of the following... ROBITUSSIN (DEXTROMETHORPHAN OR GUAIFENISEN depending on formulation)  MUCINEX (GUAIFENICEN) - usually longer acting  COUGH DROPS/LOZENGES: Whichever over-the-counter agent you prefer!  Here are some suggestions for ingredients to look for (can take both)... BENZOCAINE - numbing effect, also in CHLORASEPTIC THROAT SPRAY MENTHOL - cooling effect  HONEY: has gone head-to-head in several clinical trials with prescription cough medicines and found to be equally effective! Try 1 Teaspoon Honey + 2 Drops Lemon Juice, as much as you want to use. NONE FOR KIDS UNDER AGE 2  HERBAL TEA: There are certain ingredients which help "coat the throat" to relieve pain  such as ELM BARK, LICORICE ROOT, MARSHMALLOW ROOT  PRESCRIPTION TREATMENT FOR COUGH: Reserved for severe cases. Can include pills, syrups or inhalers.    TREAT ACHES & PAINS, FEVER: . Illness causes aches, pains and fever as your body increases its natural inflammation response to help fight the infection.  . Rest, good hydration and nutrition, and taking anti-inflammatory medications will help.  . Remember: a true fever is a temperature 100.4 or higher. If you have a fever that is 105.0 or higher, that is a dangerous level and needs medical attention in the office or in the ER!    Can take both of these together... IBUPROFEN - 400-800 mg every 6 - 8 hours.  Ibuprofen and similar medications can cause problems for people with heart disease or history of stomach ulcers, check with Dr A first if you're concerned. Lower doses are usually safe and effective.  TYLENOL (ACETAMINOPHEN) - 500-1000 mg every 6 hours. It won't cause problems with heart or stomach.     REMEMBER - THE MOST IMPORTANT THINGS YOU CAN DO TO AVOID CATCHING OR SPREADING ILLNESS INCLUDE:  . COVER YOUR COUGH WITH YOUR ARM, NOT WITH YOUR HANDS!  . DISINFECT COMMONLY USED SURFACES (SUCH AS TELEPHONES & DOORKNOBS) WHEN YOU OR SOMEONE CLOSE TO YOU IS FEELING SICK!  . BE SURE VACCINES ARE UP TO DATE - GET A FLU SHOT EVERY YEAR! . GOOD NUTRITION AND HEALTHY LIFESTYLE WILL HELP YOUR IMMUNE SYSTEM YEAR-ROUND! . AND ABOVE ALL - WASH YOUR HANDS! 

## 2015-06-30 NOTE — Progress Notes (Signed)
HPI: Cassandra Christensen is a 35 y.o. female who presents to [facility]  today for chief complaint of:  Chief Complaint  Patient presents with  . Establish Care  . Sore Throat    . Location/Quality: sinuses pressure, sore throat . Duration: 6 days total   Context: Recently seen by UC, 1/27 (4 days ago) rapid strep and strp probe both negative.  . Modifying factors: has tried the following OTC medications: none without relief. Started antibiotics Friday night (Amoxicllin 500 bid), saline nasal spry    Past medical, social and family history reviewed: Past Medical History  Diagnosis Date  . 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 Anxiety state 01/09/2014    Overview:  Updated invalid ICD 9 codes for ICD 10 go live   . Attention deficit hyperactivity disorder (ADHD) 06/30/2015    Awaiting records    History reviewed. No pertinent past surgical history. Social History  Substance Use Topics  . Smoking status: Never Smoker   . Smokeless tobacco: Not on file  . Alcohol Use: Yes     Comment: 3 q wk   History reviewed. No pertinent family history.  Current Outpatient Prescriptions  Medication Sig Dispense Refill  . amoxicillin (AMOXIL) 500 MG capsule Take 1 capsule (500 mg total) by mouth 2 (two) times daily. For 10 days 20 capsule 0  . amphetamine-dextroamphetamine (ADDERALL) 10 MG tablet Take 10 mg by mouth daily as needed (may take at lunch as needed for inattention).   0  . amphetamine-dextroamphetamine (ADDERALL) 20 MG tablet Take 20 mg by mouth daily.  0  . estradiol (ESTRACE) 2 MG tablet Take 2 mg by mouth daily.    . medroxyPROGESTERone (PROVERA) 5 MG tablet Take 25 mg by mouth See admin instructions. Take 5 tablets once every 3 months     No current facility-administered medications for this visit.   Allergies  Allergen Reactions  . Latex Hives  .  Tylenol [Acetaminophen]       Review of Systems: CONSTITUTIONAL: no fever/chills HEAD/EYES/EARS/NOSE/THROAT: yes headache, no vision change or hearing change, yes sore throat CARDIAC: No chest pain/pressure/palpitations, no orthopnea RESPIRATORY: yes cough, no shortness of breath GASTROINTESTINAL: no nausea, no vomiting, no abdominal pain/blood in stool/diarrhea/constipation MUSCULOSKELETAL: no myalgia/arthralgia   Exam:  BP 121/80 mmHg  Pulse 80  Temp(Src) 98.8 F (37.1 C) (Oral)  Ht  (1.702 m)  Wt 136 lb (61.689 kg)  BMI 21.30 kg/m2 Constitutional: VSS, see above. General Appearance: alert, well-developed, well-nourished, NAD Eyes: Normal lids and conjunctive, non-icteric sclera, PERRLA Ears, Nose, Mouth, Throat: Normal external inspection ears/nares/mouth/lips/gums, normal TM, MMM; posterior pharynx with erythema, with mild thin whitish exudate but no thick cottage-cheese-like discharge Neck: No masses, trachea midline. No thyroid enlargement/tenderness/mass appreciated, normal lymph nodes, normal neck ROM Respiratory: Normal respiratory effort. No  wheeze/rhonchi/rales Cardiovascular: S1/S2 normal, no murmur/rub/gallop auscultated. RRR. No carotid bruit or JVD. No lower extremity edema.   No results found for this or any previous visit (from the past 72 hour(s)).    ASSESSMENT/PLAN: D/C amox, start augmentin, rx as below, RTC if worse  Acute frontal sinusitis, recurrence not specified - Plan: amoxicillin-clavulanate (AUGMENTIN) 875-125 MG tablet, ipratropium (ATROVENT) 0.03 % nasal spray  Cough - Plan: guaiFENesin-codeine (ROBITUSSIN AC) 100-10 MG/5ML syrup   Pt also asked for refil ADHD meds, told her I need records and we need separate visit  to address that issue  Return if symptoms worsen or fail to improve.

## 2016-05-13 IMAGING — CR DG FINGER INDEX 2+V*L*
3 series · 3 of 3 positions shown · non-contrast
Comparison: None.

CLINICAL DATA: Pain following cat bite

EXAM:
LEFT SECOND FINGER 2+V

[finger ap]
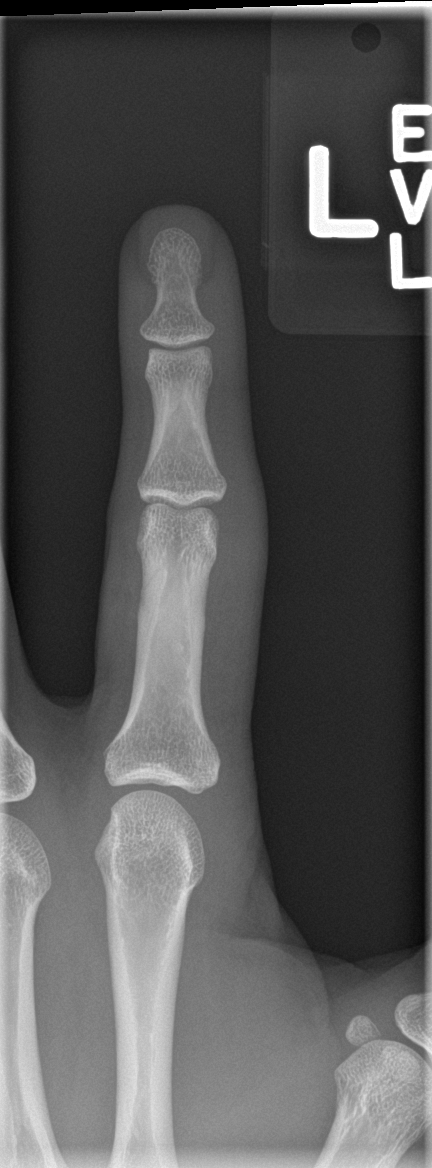

[finger obl]
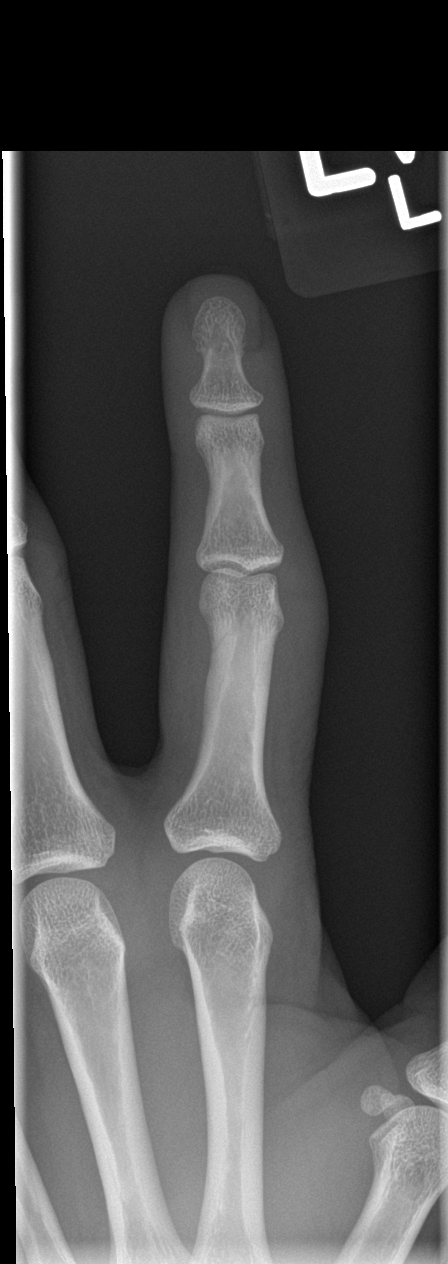

[finger lat]
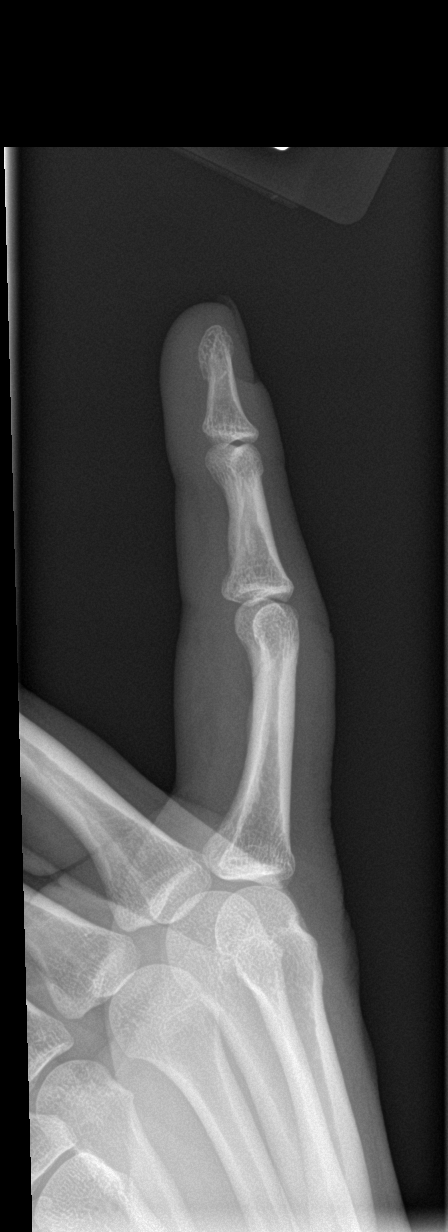

[3 of 3 positions shown; findings below may reference images not displayed]

FINDINGS: Frontal, oblique, and lateral views were obtained. There is soft
tissue swelling. No fracture or dislocation. Joint spaces appear
intact. No erosive change or bony destruction. No radiopaque foreign
body.
IMPRESSION: Soft tissue swelling. No bony lesion appreciable. No radiopaque
foreign body. Joint spaces appear intact.

## 2016-05-13 IMAGING — CR DG FINGER MIDDLE 2+V*R*
3 series · 3 of 3 positions shown · non-contrast
Comparison: None.

CLINICAL DATA: Cat bite

EXAM:
RIGHT THIRD FINGER 2+V

[finger ap]
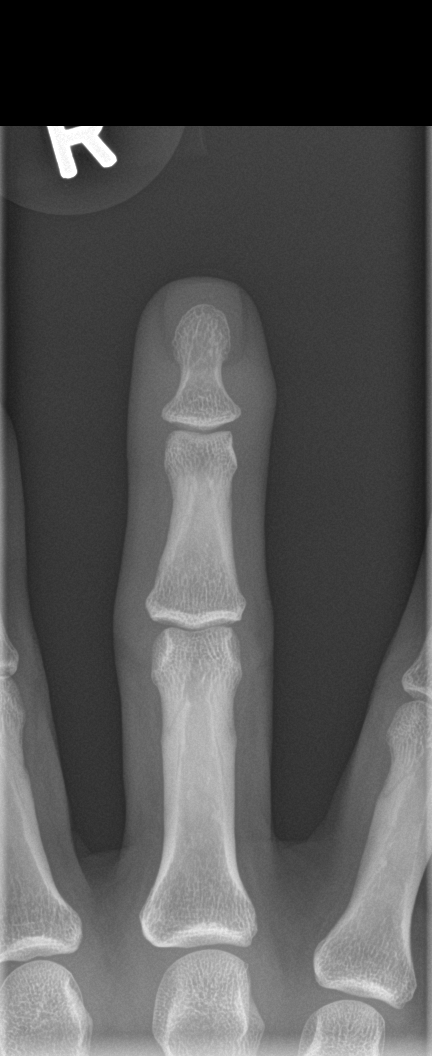

[finger obl]
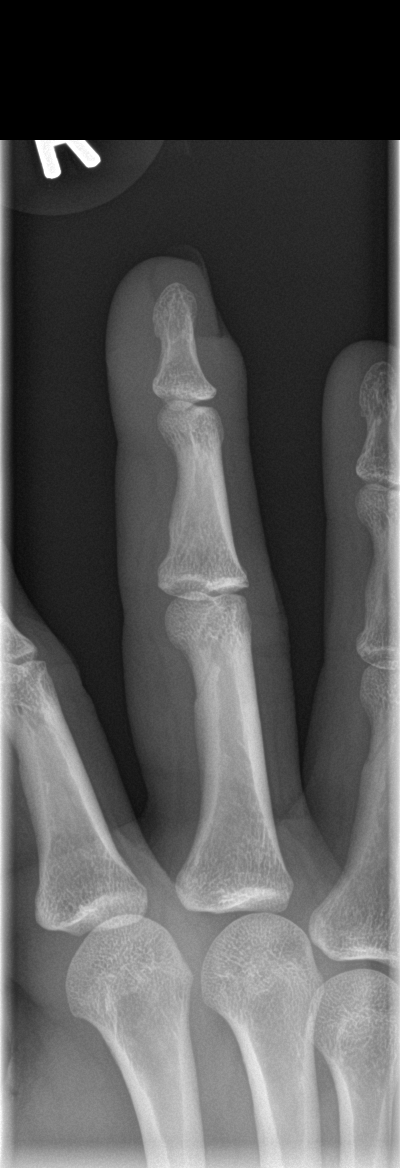

[finger lat]
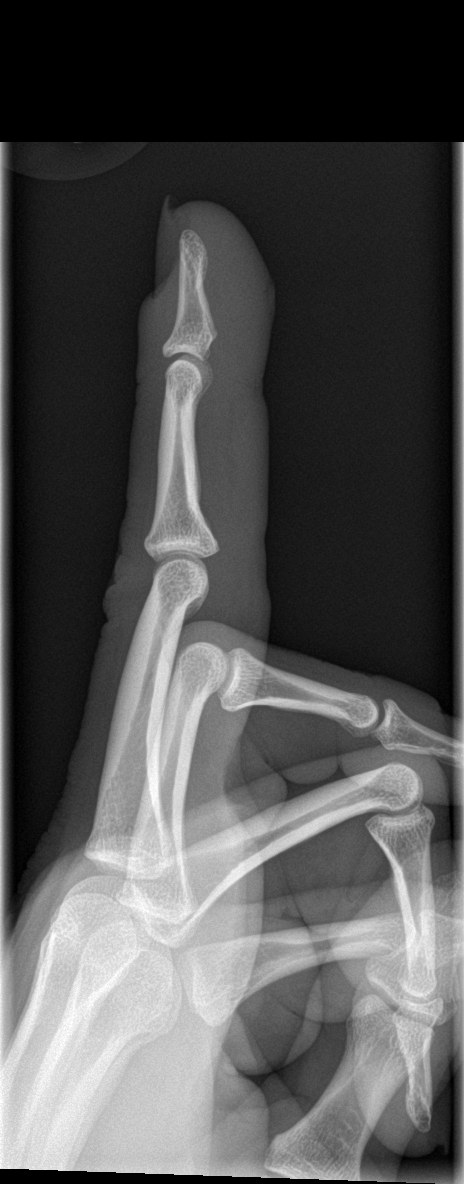

[3 of 3 positions shown; findings below may reference images not displayed]

FINDINGS: Frontal, oblique and lateral views were obtained. There is soft
tissue swelling distally, particularly in the subungual region. No
radiopaque foreign body. No fracture or dislocation. No erosive
change or bony destruction.
IMPRESSION: Soft tissue swelling distally, most notably in the subungual region.
No soft tissue air or radiopaque foreign body. No bony abnormality.
No joint abnormality.
